# Patient Record
Sex: Female | Born: 2000
Health system: Southern US, Community
[De-identification: ages and names within clinical notes are randomized; demographics above are authoritative.]

## PROBLEM LIST (undated history)

## (undated) DIAGNOSIS — N200 Calculus of kidney: Secondary | ICD-10-CM

## (undated) HISTORY — DX: Calculus of kidney: N20.0

---
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
## 2021-01-01 ENCOUNTER — Other Ambulatory Visit: Payer: Self-pay | Admitting: Physician Assistant

## 2021-01-01 DIAGNOSIS — F419 Anxiety disorder, unspecified: Secondary | ICD-10-CM

## 2021-01-01 DIAGNOSIS — F32A Depression, unspecified: Secondary | ICD-10-CM

## 2021-01-18 ENCOUNTER — Other Ambulatory Visit: Payer: Self-pay | Admitting: Physician Assistant

## 2021-01-18 DIAGNOSIS — J45990 Exercise induced bronchospasm: Secondary | ICD-10-CM

## 2021-01-19 ENCOUNTER — Encounter: Payer: Self-pay | Admitting: Physician Assistant

## 2021-01-19 ENCOUNTER — Ambulatory Visit (INDEPENDENT_AMBULATORY_CARE_PROVIDER_SITE_OTHER): Payer: BC Managed Care – PPO | Admitting: Physician Assistant

## 2021-01-19 ENCOUNTER — Other Ambulatory Visit: Payer: Self-pay

## 2021-01-19 VITALS — BP 112/73 | HR 86 | Temp 98.0°F | Ht 64.0 in | Wt 118.0 lb

## 2021-01-19 DIAGNOSIS — F419 Anxiety disorder, unspecified: Secondary | ICD-10-CM | POA: Diagnosis not present

## 2021-01-19 DIAGNOSIS — Z0000 Encounter for general adult medical examination without abnormal findings: Secondary | ICD-10-CM | POA: Diagnosis not present

## 2021-01-19 NOTE — Patient Instructions (Signed)
Preventive Care 21-19 Years Old, Female Preventive care refers to lifestyle choices and visits with your health care provider that can promote health and wellness. This includes: A yearly physical exam. This is also called an annual wellness visit. Regular dental and eye exams. Immunizations. Screening for certain conditions. Healthy lifestyle choices, such as: Eating a healthy diet. Getting regular exercise. Not using drugs or products that contain nicotine and tobacco. Limiting alcohol use. What can I expect for my preventive care visit? Physical exam Your health care provider may check your: Height and weight. These may be used to calculate your BMI (body mass index). BMI is a measurement that tells if you are at a healthy weight. Heart rate and blood pressure. Body temperature. Skin for abnormal spots. Counseling Your health care provider may ask you questions about your: Past medical problems. Family's medical history. Alcohol, tobacco, and drug use. Emotional well-being. Home life and relationship well-being. Sexual activity. Diet, exercise, and sleep habits. Work and work environment. Access to firearms. Method of birth control. Menstrual cycle. Pregnancy history. What immunizations do I need? Vaccines are usually given at various ages, according to a schedule. Your health care provider will recommend vaccines for you based on your age, medical history, and lifestyle or other factors, such as travel or where you work. What tests do I need? Blood tests Lipid and cholesterol levels. These may be checked every 5 years starting at age 20. Hepatitis C test. Hepatitis B test. Screening Diabetes screening. This is done by checking your blood sugar (glucose) after you have not eaten for a while (fasting). STD (sexually transmitted disease) testing, if you are at risk. BRCA-related cancer screening. This may be done if you have a family history of breast, ovarian, tubal, or  peritoneal cancers. Pelvic exam and Pap test. This may be done every 3 years starting at age 21. Starting at age 30, this may be done every 5 years if you have a Pap test in combination with an HPV test. Talk with your health care provider about your test results, treatment options, and if necessary, the need for more tests. Follow these instructions at home: Eating and drinking  Eat a healthy diet that includes fresh fruits and vegetables, whole grains, lean protein, and low-fat dairy products. Take vitamin and mineral supplements as recommended by your health care provider. Do not drink alcohol if: Your health care provider tells you not to drink. You are pregnant, may be pregnant, or are planning to become pregnant. If you drink alcohol: Limit how much you have to 0-1 drink a day. Be aware of how much alcohol is in your drink. In the [place] one drink equals one 12 oz bottle of beer (355 mL), one 5 oz glass of wine (148 mL), or one 1 oz glass of hard liquor (44 mL). Lifestyle Take daily care of your teeth and gums. Brush your teeth every morning and night with fluoride toothpaste. Floss one time each day. Stay active. Exercise for at least 30 minutes 5 or more days each week. Do not use any products that contain nicotine or tobacco, such as cigarettes, e-cigarettes, and chewing tobacco. If you need help quitting, ask your health care provider. Do not use drugs. If you are sexually active, practice safe sex. Use a condom or other form of protection to prevent STIs (sexually transmitted infections). If you do not wish to become pregnant, use a form of birth control. If you plan to become pregnant, see your health care provider   for a prepregnancy visit. Find healthy ways to cope with stress, such as: Meditation, yoga, or listening to music. Journaling. Talking to a trusted person. Spending time with friends and family. Safety Always wear your seat belt while driving or riding in a  vehicle. Do not drive: If you have been drinking alcohol. Do not ride with someone who has been drinking. When you are tired or distracted. While texting. Wear a helmet and other protective equipment during sports activities. If you have firearms in your house, make sure you follow all gun safety procedures. Seek help if you have been physically or sexually abused. What's next? Go to your health care provider once a year for an annual wellness visit. Ask your health care provider how often you should have your eyes and teeth checked. Stay up to date on all vaccines. This information is not intended to replace advice given to you by your health care provider. Make sure you discuss any questions you have with your health care provider. Document Revised: 05/23/2020 Document Reviewed: 11/24/2017 Elsevier Patient Education  2022 Elsevier Inc.  

## 2021-01-19 NOTE — Progress Notes (Signed)
Subjective:     Christy Brown is a 20 y.o. female and is here for a comprehensive physical exam. The patient reports no problems.  Social History   Socioeconomic History   Marital status: Single    Spouse name: Not on file   Number of children: Not on file   Years of education: Not on file   Highest education level: Not on file  Occupational History   Not on file  Tobacco Use   Smoking status: Never   Smokeless tobacco: Never  Vaping Use   Vaping Use: Never used  Substance and Sexual Activity   Alcohol use: No   Drug use: No   Sexual activity: Yes    Birth control/protection: Pill    Comment: 1st intercourse 20 yo-Fewer than 5 partners  Other Topics Concern   Not on file  Social History Narrative   Not on file   Social Determinants of Health   Financial Resource Strain: Not on file  Food Insecurity: Not on file  Transportation Needs: Not on file  Physical Activity: Not on file  Stress: Not on file  Social Connections: Not on file  Intimate Partner Violence: Not on file   Health Maintenance  Topic Date Due   HPV VACCINES (1 - 2-dose series) Never done   CHLAMYDIA SCREENING  Never done   HIV Screening  Never done   Hepatitis C Screening  Never done   INFLUENZA VACCINE  Never done    The following portions of the patient's history were reviewed and updated as appropriate: allergies, current medications, past family history, past medical history, past social history, past surgical history, and problem list.  Review of Systems Pertinent items noted in HPI and remainder of comprehensive ROS otherwise negative.   Objective:    BP 112/73   Pulse 86   Temp 98 F (36.7 C)   Ht 5\' 4"  (1.626 m)   Wt 118 lb (53.5 kg)   SpO2 100%   BMI 20.25 kg/m  General appearance: alert, cooperative, and no distress Head: Normocephalic, without obvious abnormality, atraumatic Eyes: conjunctivae/corneas clear. PERRL, EOM's intact. Fundi benign. Ears: normal TM's and  external ear canals both ears Nose: Nares normal. Septum midline. Mucosa normal. No drainage or sinus tenderness. Throat: lips, mucosa, and tongue normal; teeth and gums normal Neck: no adenopathy, supple, symmetrical, trachea midline, and thyroid: normal to inspection and palpation Back: symmetric, no curvature. ROM normal. No CVA tenderness. Lungs: clear to auscultation bilaterally Breasts: Inspection negative, Taught monthly breast self examination Heart: regular rate and rhythm, S1, S2 normal, no murmur, click, rub or gallop Abdomen: soft, non-tender; bowel sounds normal; no masses,  no organomegaly Pelvic: deferred Extremities: extremities normal, atraumatic, no cyanosis or edema Pulses: 2+ and symmetric Skin: Skin color, texture, turgor normal. No rashes or lesions Lymph nodes: Cervical adenopathy: normal and Supraclavicular adenopathy: normal Neurologic: Grossly normal    Assessment:    Healthy female exam.     Plan:  -Patient declined GC/chlamydia screening, immunizations including influenza and HPV, Hep C and HIV screenings. -Continue to avoid tobacco and etoh use. -Continue to stay active and follow a heart healthy diet. -Patient reports not taking sertraline, discussed alternative SSRI's (Paxil, Lexapro, Celexa, Prozac). Will let me know if decides to try another medication. Recommend to incorporate mindfulness therapy. -Follow up in 6 months for mood  See After Visit Summary for Counseling Recommendations

## 2021-02-23 ENCOUNTER — Other Ambulatory Visit: Payer: Self-pay

## 2021-02-23 ENCOUNTER — Emergency Department (HOSPITAL_BASED_OUTPATIENT_CLINIC_OR_DEPARTMENT_OTHER)
Admission: EM | Admit: 2021-02-23 | Discharge: 2021-02-23 | Disposition: A | Discharge status: Home / Self Care | Payer: BC Managed Care – PPO | Attending: Emergency Medicine | Admitting: Emergency Medicine

## 2021-02-23 ENCOUNTER — Encounter (HOSPITAL_BASED_OUTPATIENT_CLINIC_OR_DEPARTMENT_OTHER): Payer: Self-pay

## 2021-02-23 ENCOUNTER — Emergency Department (HOSPITAL_BASED_OUTPATIENT_CLINIC_OR_DEPARTMENT_OTHER): Payer: BC Managed Care – PPO

## 2021-02-23 DIAGNOSIS — N132 Hydronephrosis with renal and ureteral calculous obstruction: Secondary | ICD-10-CM | POA: Diagnosis not present

## 2021-02-23 DIAGNOSIS — N201 Calculus of ureter: Secondary | ICD-10-CM

## 2021-02-23 DIAGNOSIS — R109 Unspecified abdominal pain: Secondary | ICD-10-CM | POA: Diagnosis present

## 2021-02-23 DIAGNOSIS — J45990 Exercise induced bronchospasm: Secondary | ICD-10-CM | POA: Diagnosis not present

## 2021-02-23 LAB — URINALYSIS, ROUTINE W REFLEX MICROSCOPIC
Glucose, UA: NEGATIVE mg/dL
Ketones, ur: NEGATIVE mg/dL
Leukocytes,Ua: NEGATIVE
Nitrite: NEGATIVE
Protein, ur: 100 mg/dL — AB
Specific Gravity, Urine: 1.025 (ref 1.005–1.030)
pH: 7 (ref 5.0–8.0)

## 2021-02-23 LAB — URINALYSIS, MICROSCOPIC (REFLEX)

## 2021-02-23 LAB — CBC
HCT: 36.9 % (ref 36.0–46.0)
Hemoglobin: 12.6 g/dL (ref 12.0–15.0)
MCH: 28.8 pg (ref 26.0–34.0)
MCHC: 34.1 g/dL (ref 30.0–36.0)
MCV: 84.4 fL (ref 80.0–100.0)
Platelets: 225 10*3/uL (ref 150–400)
RBC: 4.37 MIL/uL (ref 3.87–5.11)
RDW: 13.1 % (ref 11.5–15.5)
WBC: 10.8 10*3/uL — ABNORMAL HIGH (ref 4.0–10.5)
nRBC: 0 % (ref 0.0–0.2)

## 2021-02-23 LAB — BASIC METABOLIC PANEL
Anion gap: 10 (ref 5–15)
BUN: 11 mg/dL (ref 6–20)
CO2: 23 mmol/L (ref 22–32)
Calcium: 9 mg/dL (ref 8.9–10.3)
Chloride: 103 mmol/L (ref 98–111)
Creatinine, Ser: 1.02 mg/dL — ABNORMAL HIGH (ref 0.44–1.00)
GFR, Estimated: >60 mL/min (ref >60)
Glucose, Bld: 143 mg/dL — ABNORMAL HIGH (ref 70–99)
Potassium: 3.7 mmol/L (ref 3.5–5.1)
Sodium: 136 mmol/L (ref 135–145)

## 2021-02-23 LAB — LIPASE, BLOOD: Lipase: 25 U/L (ref 11–51)

## 2021-02-23 LAB — PREGNANCY, URINE: Preg Test, Ur: NEGATIVE

## 2021-02-23 MED ORDER — ONDANSETRON HCL 4 MG PO TABS: 4.0000 mg | ORAL_TABLET | Freq: Three times a day (TID) | ORAL | 0 refills | Status: AC | PRN

## 2021-02-23 MED ORDER — KETOROLAC TROMETHAMINE 30 MG/ML IJ SOLN
30.0000 mg | Freq: Once | INTRAMUSCULAR | Status: AC
  Administered 2021-02-23: 15:00:00 30 mg via INTRAVENOUS
  Filled 2021-02-23: qty 1

## 2021-02-23 MED ORDER — ONDANSETRON HCL 4 MG/2ML IJ SOLN
4.0000 mg | Freq: Once | INTRAMUSCULAR | Status: AC
  Administered 2021-02-23: 15:00:00 4 mg via INTRAVENOUS
  Filled 2021-02-23: qty 2

## 2021-02-23 MED ORDER — ONDANSETRON HCL 4 MG/2ML IJ SOLN
4.0000 mg | Freq: Once | INTRAMUSCULAR | Status: AC | PRN
  Administered 2021-02-23: 12:00:00 4 mg via INTRAVENOUS
  Filled 2021-02-23: qty 2

## 2021-02-23 NOTE — ED Triage Notes (Signed)
Right flank pain x today with vomiting - Patient has history of kidney stones.

## 2021-02-23 NOTE — ED Provider Notes (Signed)
[facility] EMERGENCY DEPARTMENT Provider Note   CSN: [id number] Arrival date & time: 02/23/21  1043     History Chief Complaint  Patient presents with   Flank Pain    Christy Brown is a 20 y.o. female.  Patient has a past medical history of nephrolithiasis.  She presents today with right flank pain that started about six hours ago.  She has had 4 episodes of nonbloody and nonbilious vomiting with associated nausea.  She has had some difficulty keeping p.o. intake down.  Pain has been constant.  She has had a kidney stone in the past with similar symptoms.  He denies any dysuria, hematuria, chest pain, shortness of breath, abdominal pain, fevers, or chills.   Flank Pain Pertinent negatives include no chest pain, no abdominal pain, no headaches and no shortness of breath.      Past Medical History:  Diagnosis Date   Kidney stone     Patient Active Problem List   Diagnosis Date Noted   Exercise-induced asthma 09/20/2019   Dysmenorrhea in adolescent 04/27/2017    History reviewed. No pertinent surgical history.   OB History     Gravida  0   Para  0   Term  0   Preterm  0   AB  0   Living  0      SAB  0   IAB  0   Ectopic  0   Multiple  0   Live Births  0           Family History  Problem Relation Age of Onset   Prostate cancer Father    Heart disease Maternal Grandfather    Heart attack Maternal Grandfather     Social History   Tobacco Use   Smoking status: Never   Smokeless tobacco: Never  Vaping Use   Vaping Use: Never used  Substance Use Topics   Alcohol use: No   Drug use: No    Home Medications Prior to Admission medications   Medication Sig Start Date End Date Taking? Authorizing Provider  ondansetron (ZOFRAN) 4 MG tablet Take 1 tablet (4 mg total) by mouth every 8 (eight) hours as needed for up to 10 days for nausea or vomiting. 02/23/21 03/05/21 Yes Anaise Sterbenz, Finis Bud, PA-C  albuterol (VENTOLIN HFA) 108 (90  Base) MCG/ACT inhaler TAKE 2 PUFFS BY MOUTH EVERY 6 HOURS AS NEEDED FOR WHEEZE OR SHORTNESS OF BREATH 01/19/21   Abonza, Maritza, PA-C  drospirenone-ethinyl estradiol (NIKKI) 3-0.02 MG tablet Take 1 tablet by mouth daily. 10/20/20   Wyline Beady A, NP  sertraline (ZOLOFT) 25 MG tablet TAKE 1 TABLET (25 MG TOTAL) BY MOUTH DAILY. 01/01/21   Mayer Masker, PA-C    Allergies    Patient has no known allergies.  Review of Systems   Review of Systems  Constitutional:  Negative for chills and fever.  HENT:  Negative for congestion, rhinorrhea and sore throat.   Eyes:  Negative for visual disturbance.  Respiratory:  Negative for cough, chest tightness and shortness of breath.   Cardiovascular:  Negative for chest pain, palpitations and leg swelling.  Gastrointestinal:  Positive for nausea and vomiting. Negative for abdominal pain, blood in stool, constipation and diarrhea.  Genitourinary:  Positive for flank pain. Negative for dysuria and hematuria.  Musculoskeletal:  Negative for back pain.  Skin:  Negative for rash and wound.  Neurological:  Negative for dizziness, syncope, weakness, light-headedness and headaches.  Psychiatric/Behavioral:  Negative for confusion.  All other systems reviewed and are negative.  Physical Exam Updated Vital Signs BP 117/69   Pulse 70   Temp 97.7 F (36.5 C) (Oral)   Resp 18   Ht 5\' 4"  (1.626 m)   Wt 52.2 kg   LMP 01/30/2021 Comment: neg upreg in er today.  SpO2 99%   BMI 19.74 kg/m   Physical Exam Vitals and nursing note reviewed.  Constitutional:      General: She is not in acute distress.    Appearance: Normal appearance. She is well-developed. She is not ill-appearing, toxic-appearing or diaphoretic.  HENT:     Head: Normocephalic and atraumatic.     Nose: No nasal deformity.     Mouth/Throat:     Lips: Pink. No lesions.  Eyes:     General: Gaze aligned appropriately. No scleral icterus.       Right eye: No discharge.        Left eye:  No discharge.     Conjunctiva/sclera: Conjunctivae normal.     Right eye: Right conjunctiva is not injected. No exudate or hemorrhage.    Left eye: Left conjunctiva is not injected. No exudate or hemorrhage. Pulmonary:     Effort: Pulmonary effort is normal. No respiratory distress.  Abdominal:     General: Abdomen is flat. There is no distension.     Palpations: Abdomen is soft.     Tenderness: There is no abdominal tenderness. There is right CVA tenderness. There is no left CVA tenderness, guarding or rebound.  Skin:    General: Skin is warm and dry.     Coloration: Skin is not jaundiced or pale.     Findings: No bruising, erythema, lesion or rash.  Neurological:     Mental Status: She is alert and oriented to person, place, and time.  Psychiatric:        Mood and Affect: Mood normal.        Speech: Speech normal.        Behavior: Behavior normal. Behavior is cooperative.    ED Results / Procedures / Treatments   Labs (all labs ordered are listed, but only abnormal results are displayed) Labs Reviewed  URINALYSIS, ROUTINE W REFLEX MICROSCOPIC - Abnormal; Notable for the following components:      Result Value   APPearance HAZY (*)    Hgb urine dipstick SMALL (*)    Bilirubin Urine SMALL (*)    Protein, ur 100 (*)    All other components within normal limits  BASIC METABOLIC PANEL - Abnormal; Notable for the following components:   Glucose, Bld 143 (*)    Creatinine, Ser 1.02 (*)    All other components within normal limits  CBC - Abnormal; Notable for the following components:   WBC 10.8 (*)    All other components within normal limits  URINALYSIS, MICROSCOPIC (REFLEX) - Abnormal; Notable for the following components:   Bacteria, UA FEW (*)    All other components within normal limits  PREGNANCY, URINE  LIPASE, BLOOD    EKG None  Radiology CT Renal Stone Study  Result Date: 02/23/2021 CLINICAL DATA:  Right flank pain with nausea and vomiting. Kidney stone  suspected. EXAM: CT ABDOMEN AND PELVIS WITHOUT CONTRAST TECHNIQUE: Multidetector CT imaging of the abdomen and pelvis was performed following the standard protocol without IV contrast. COMPARISON:  None. FINDINGS: Lower chest: Normal Hepatobiliary: Normal Pancreas: Normal Spleen: Normal Adrenals/Urinary Tract: Adrenal glands are normal. Left kidney is normal except for a nonobstructing 2  mm stone in the midportion. Right kidney shows swelling and hydroureteronephrosis with the ureter being dilated all the way to the UVJ where there is a 1.5 mm stone. Stomach/Bowel: Normal Vascular/Lymphatic: Normal Reproductive: Normal Other: None Musculoskeletal: Normal IMPRESSION: 1.5 mm stone at the right UVJ with mild right hydroureteronephrosis. 2 mm nonobstructing stone in the midportion of the left kidney. Electronically Signed   By: Nelson Chimes M.D.   On: 02/23/2021 14:00    Procedures Procedures   Medications Ordered in ED Medications  ondansetron (ZOFRAN) injection 4 mg (4 mg Intravenous Given 02/23/21 1218)  ketorolac (TORADOL) 30 MG/ML injection 30 mg (30 mg Intravenous Given 02/23/21 1443)  ondansetron (ZOFRAN) injection 4 mg (4 mg Intravenous Given 02/23/21 1451)    ED Course  I have reviewed the triage vital signs and the nursing notes.  Pertinent labs & imaging results that were available during my care of the patient were reviewed by me and considered in my medical decision making (see chart for details).  Clinical Course as of 02/23/21 2321  Mon Feb 23, 2021  1502 Right stone in ureter [GL]    Clinical Course User Index [GL] Stacee Earp, Adora Fridge, PA-C   MDM Rules/Calculators/A&P                          With known history of nephrolithiasis presents the emergency department with 6 hours of right-sided flank pain that was associated with nausea and vomiting. She is afebrile and vital stable on arrival. Exam significant for right CVA tenderness with normal abdominal exam.  Labs are  notable for trace leukocytosis to 10.8.  CBC otherwise unremarkable.  BMP with normal creatinine.  Urinalysis with no evidence of infection.  CT stone study is notable for a 1.5 mm stone at the right UVJ with mild hydroureteronephrosis.  These findings are consistent with patient's presenting symptoms.  The stone is fairly small, so patient will likely pass on her own.  Her symptoms were treated with Zofran and Toradol while in the ED.  She was given a liter of fluids due to decreased p.o. intake.  She has no signs of infected stone or acute kidney injury.  Feel she should be able to treat her symptoms at home safely.  She has never been to see urology and has had multiple kidney stones in the past.  We will send her home with follow-up to urology.   Final Clinical Impression(s) / ED Diagnoses Final diagnoses:  Ureterolithiasis    Rx / DC Orders ED Discharge Orders          Ordered    ondansetron (ZOFRAN) 4 MG tablet  Every 8 hours PRN        02/23/21 1628             Adolphus Birchwood, PA-C 02/23/21 2321    Margette Fast, MD 03/02/21 1036

## 2021-02-23 NOTE — Discharge Instructions (Signed)
Please follow up with Urology. Schedule an appointment in the morning.  I have prescribed you Zofran for your nausea.  You can take 600 mg Ibuprofen every 6-8 hours until your pain improves. Do not take this longer than 5 days. If you develop fevers or worsening symptoms, please return to the ED.

## 2021-03-29 ENCOUNTER — Other Ambulatory Visit: Payer: Self-pay | Admitting: Physician Assistant

## 2021-03-29 DIAGNOSIS — F419 Anxiety disorder, unspecified: Secondary | ICD-10-CM

## 2021-03-29 DIAGNOSIS — F32A Depression, unspecified: Secondary | ICD-10-CM

## 2021-09-27 ENCOUNTER — Other Ambulatory Visit: Payer: Self-pay | Admitting: Physician Assistant

## 2021-09-27 DIAGNOSIS — F32A Depression, unspecified: Secondary | ICD-10-CM

## 2021-09-27 DIAGNOSIS — F419 Anxiety disorder, unspecified: Secondary | ICD-10-CM

## 2021-10-21 ENCOUNTER — Ambulatory Visit (INDEPENDENT_AMBULATORY_CARE_PROVIDER_SITE_OTHER): Payer: BC Managed Care – PPO | Admitting: Nurse Practitioner

## 2021-10-21 ENCOUNTER — Encounter: Payer: Self-pay | Admitting: Nurse Practitioner

## 2021-10-21 VITALS — BP 108/74 | HR 100 | Ht 64.0 in | Wt 113.0 lb

## 2021-10-21 DIAGNOSIS — Z01419 Encounter for gynecological examination (general) (routine) without abnormal findings: Secondary | ICD-10-CM

## 2021-10-21 DIAGNOSIS — N946 Dysmenorrhea, unspecified: Secondary | ICD-10-CM

## 2021-10-21 DIAGNOSIS — R110 Nausea: Secondary | ICD-10-CM

## 2021-10-21 DIAGNOSIS — Z3041 Encounter for surveillance of contraceptive pills: Secondary | ICD-10-CM

## 2021-10-21 DIAGNOSIS — Z113 Encounter for screening for infections with a predominantly sexual mode of transmission: Secondary | ICD-10-CM

## 2021-10-21 DIAGNOSIS — Z0000 Encounter for general adult medical examination without abnormal findings: Secondary | ICD-10-CM

## 2021-10-21 MED ORDER — DROSPIRENONE-ETHINYL ESTRADIOL 3-0.02 MG PO TABS: 1.0000 | ORAL_TABLET | Freq: Every day | ORAL | 3 refills | Status: DC

## 2021-10-21 NOTE — Progress Notes (Signed)
   Christy Brown 2020/06/30 [id number]   History:  21 y.o. G0 presents for annual exam without GYN complaints. Monthly cycles. Started COCs for dysmenorrhea, good management.  Declines Gardasil. Anxiety and depression managed by PCP. Reports severe nausea and vomiting a few days before menses recently. Missed 4 doses of Zoloft at that time.   Gynecologic History Patient's last menstrual period was 10/09/2021 (approximate). Period Cycle (Days): 28 Period Duration (Days): 3-5 Menstrual Flow:  (starts light, gets heavy, then finishes light) Menstrual Control: Tampon Dysmenorrhea: (!) Severe Dysmenorrhea Symptoms: Cramping, Nausea Contraception/Family planning: OCP (estrogen/progesterone) Sexually active: Yes  Health Maintenance Last Pap: Not indicated Last mammogram: Not indicated Last colonoscopy: Not indicated Last Dexa: Not indicated  Past medical history, past surgical history, family history and social history were all reviewed and documented in the EPIC chart. 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 Plans to work with brother.   ROS:  A ROS was performed and pertinent positives and negatives are included.  Exam:  Vitals:   10/21/21 1501  BP: 108/74  Pulse: 100  SpO2: 98%  Weight: 113 lb (51.3 kg)  Height: 5\' 4"  (1.626 m)     Body mass index is 19.4 kg/m.  General appearance:  Normal Thyroid:  Symmetrical, normal in size, without palpable masses or nodularity. Respiratory  Auscultation:  Clear without wheezing or rhonchi Cardiovascular  Auscultation:  Regular rate, without rubs, murmurs or gallops  Edema/varicosities:  Not grossly evident Abdominal  Soft,nontender, without masses, guarding or rebound.  Liver/spleen:  No organomegaly noted  Hernia:  None appreciated  Skin  Inspection:  Grossly normal Breasts: Not indicated per guidelines Genitourinary   Inguinal/mons:  Normal without inguinal adenopathy  External genitalia:  Normal  appearing vulva with no masses, tenderness, or lesions  BUS/Urethra/Skene's glands:  Normal  Vagina:  Normal appearing with normal color and discharge, no lesions  Cervix:  Normal appearing without discharge or lesions  Uterus:  Normal in size, shape and contour.  Midline and mobile, nontender  Adnexa/parametria:     Rt: Normal in size, without masses or tenderness.   Lt: Normal in size, without masses or tenderness.  Anus and perineum: Normal  Digital rectal exam: Normal sphincter tone without palpated masses or tenderness  Patient informed chaperone available to be present for breast and pelvic exam. Patient has requested no chaperone to be present. Patient has been advised what will be completed during breast and pelvic exam.    Assessment/Plan:  21 y.o. G0 for annual exam.   Well female exam with routine gynecological exam - Education provided on SBEs, importance of preventative screenings, current guidelines, high calcium diet, regular exercise, safe sex, and multivitamin daily.   Encounter for surveillance of contraceptive pills - Plan: drospirenone-ethinyl estradiol (NIKKI) 3-0.02 MG tablet daily. Taking as prescribe. Refill x 1 year provided.   Dysmenorrhea - Plan: drospirenone-ethinyl estradiol (NIKKI) 3-0.02 MG tablet daily. Good management.   Screen for STD (sexually transmitted disease) - Plan: SURESWAB CT/NG/T. vaginalis  Nausea - likely s/t missed Zoloft. Nausea has resolved since restarting. Discussed withdrawal symptoms and not to stop abruptly.   Follow up in 1 year for annual.     MCKENIZE MEZERA [facility], 3:18 PM 10/21/2021

## 2021-10-22 LAB — SURESWAB CT/NG/T. VAGINALIS
C. trachomatis RNA, TMA: NOT DETECTED
N. gonorrhoeae RNA, TMA: NOT DETECTED
Trichomonas vaginalis RNA: NOT DETECTED

## 2021-11-16 ENCOUNTER — Telehealth: Payer: Self-pay | Admitting: Physician Assistant

## 2021-11-16 DIAGNOSIS — F32A Depression, unspecified: Secondary | ICD-10-CM

## 2021-11-16 DIAGNOSIS — F419 Anxiety disorder, unspecified: Secondary | ICD-10-CM

## 2021-11-16 NOTE — Telephone Encounter (Signed)
Patient requesting refill of Zoloft. Please advise.  

## 2021-11-17 MED ORDER — SERTRALINE HCL 25 MG PO TABS: 25.0000 mg | ORAL_TABLET | Freq: Every day | ORAL | 0 refills | Status: DC

## 2021-11-17 NOTE — Telephone Encounter (Signed)
Tried calling patient back-no answer and vm not set up

## 2021-11-17 NOTE — Telephone Encounter (Signed)
30 day refill sent to the pharmacy. Patient will need to schedule an appointment for further medication refills per last office note. AS< CMA

## 2022-01-20 ENCOUNTER — Ambulatory Visit (INDEPENDENT_AMBULATORY_CARE_PROVIDER_SITE_OTHER): Payer: BC Managed Care – PPO | Admitting: Physician Assistant

## 2022-01-20 ENCOUNTER — Encounter: Payer: Self-pay | Admitting: Physician Assistant

## 2022-01-20 VITALS — BP 98/64 | HR 73 | Temp 97.7°F | Ht 64.0 in | Wt 114.0 lb

## 2022-01-20 DIAGNOSIS — F32A Depression, unspecified: Secondary | ICD-10-CM | POA: Diagnosis not present

## 2022-01-20 DIAGNOSIS — F419 Anxiety disorder, unspecified: Secondary | ICD-10-CM

## 2022-01-20 DIAGNOSIS — Z0000 Encounter for general adult medical examination without abnormal findings: Secondary | ICD-10-CM | POA: Diagnosis not present

## 2022-01-20 MED ORDER — SERTRALINE HCL 25 MG PO TABS: 25.0000 mg | ORAL_TABLET | Freq: Every day | ORAL | 0 refills | Status: DC

## 2022-01-20 MED ORDER — PROPRANOLOL HCL 40 MG PO TABS: 40.0000 mg | ORAL_TABLET | Freq: Two times a day (BID) | ORAL | 1 refills | Status: DC | PRN

## 2022-01-20 NOTE — Progress Notes (Signed)
Complete physical exam   Patient: Christy Brown   DOB: 2000/07/26   21 y.o. Female  MRN: [id number] Visit Date: 01/20/2022   Chief Complaint  Patient presents with   Annual Exam   Subjective    Christy Brown is a 21 y.o. female who presents today for a complete physical exam.  She reports consuming a general diet. The patient does not participate in regular exercise at present. She generally feels fairly well. She does have additional problems to discuss today- anxiety. Patient is currently a full-time student at [employer] and has two jobs so has a lot on her plate.     Past Medical History:  Diagnosis Date   Kidney stone    No past surgical history on file. Social History   Socioeconomic History   Marital status: Single    Spouse name: Not on file   Number of children: Not on file   Years of education: Not on file   Highest education level: Not on file  Occupational History   Not on file  Tobacco Use   Smoking status: Never   Smokeless tobacco: Never  Vaping Use   Vaping Use: Never used  Substance and Sexual Activity   Alcohol use: Yes    Comment: occasionally   Drug use: No   Sexual activity: Yes    Partners: Male    Birth control/protection: Pill    Comment: 1st intercourse 21 yo-Fewer than 5 partners  Other Topics Concern   Not on file  Social History Narrative   Not on file   Social Determinants of Health   Financial Resource Strain: Not on file  Food Insecurity: Not on file  Transportation Needs: Not on file  Physical Activity: Not on file  Stress: Not on file  Social Connections: Not on file  Intimate Partner Violence: Not on file     Medications: Outpatient Medications Prior to Visit  Medication Sig   albuterol (VENTOLIN HFA) 108 (90 Base) MCG/ACT inhaler TAKE 2 PUFFS BY MOUTH EVERY 6 HOURS AS NEEDED FOR WHEEZE OR SHORTNESS OF BREATH   drospirenone-ethinyl estradiol (NIKKI) 3-0.02 MG tablet Take 1 tablet by mouth daily.    [DISCONTINUED] sertraline (ZOLOFT) 25 MG tablet Take 1 tablet (25 mg total) by mouth daily.   No facility-administered medications prior to visit.    Review of Systems Review of Systems:  A fourteen system review of systems was performed and found to be positive as per HPI.  Last CBC Lab Results  Component Value Date   WBC 10.8 (H) 02/23/2021   HGB 12.6 02/23/2021   HCT 36.9 02/23/2021   MCV 84.4 02/23/2021   MCH 28.8 02/23/2021   RDW 13.1 02/23/2021   PLT 225 40/98/1191   Last metabolic panel Lab Results  Component Value Date   GLUCOSE 143 (H) 02/23/2021   NA 136 02/23/2021   K 3.7 02/23/2021   CL 103 02/23/2021   CO2 23 02/23/2021   BUN 11 02/23/2021   CREATININE 1.02 (H) 02/23/2021   GFRNONAA >60 02/23/2021   CALCIUM 9.0 02/23/2021   ANIONGAP 10 02/23/2021   Last lipids No results found for: "CHOL", "HDL", "LDLCALC", "LDLDIRECT", "TRIG", "CHOLHDL" Last hemoglobin A1c No results found for: "HGBA1C" Last thyroid functions Lab Results  Component Value Date   TSH 1.25 04/27/2017   Last vitamin D No results found for: "25OHVITD2", "25OHVITD3", "VD25OH"    Objective    BP 98/64   Pulse 73   Temp 97.7 F (36.5  C) (Temporal)   Ht _0  (1.626 m)   Wt 114 lb (51.7 kg)   BMI 19.57 kg/m  BP Readings from Last 3 Encounters:  01/20/22 98/64  10/21/21 108/74  02/23/21 117/69   Wt Readings from Last 3 Encounters:  01/20/22 114 lb (51.7 kg) (22 %, Z= -0.77)*  10/21/21 113 lb (51.3 kg) (21 %, Z= -0.82)*  02/23/21 115 lb (52.2 kg) (27 %, Z= -0.62)*   * Growth percentiles are based on CDC (Girls, 2-20 Years) data.      Physical Exam   General Appearance:    Alert, cooperative, in no acute distress, appears stated age   Head:    Normocephalic, without obvious abnormality, atraumatic  Eyes:    PERRL, conjunctiva/corneas clear, EOM's intact, fundi    benign, both eyes  Ears:    Normal TM's and external ear canals, both ears  Nose:   Nares normal, septum  midline, mucosa normal, no drainage    or sinus tenderness  Throat:   Lips, mucosa, and tongue normal; teeth and gums normal  Neck:   Supple, symmetrical, trachea midline, no adenopathy;    thyroid:  no enlargement/tenderness/nodules; no JVD  Back:     Symmetric, no curvature, ROM normal, no CVA tenderness  Lungs:     Clear to auscultation bilaterally, respirations unlabored  Chest Wall:    No tenderness or deformity   Heart:    Normal heart rate. Normal rhythm. No murmurs, rubs, or gallops.   Breast Exam:    deferred  Abdomen:     Soft, non-tender, bowel sounds active all four quadrants,    no masses, no organomegaly  Pelvic:    deferred  Extremities:   All extremities are intact. No cyanosis or edema  Pulses:   2+ and symmetric all extremities  Skin:   Skin color, texture, turgor normal, no rashes or suspicious lesions  Lymph nodes:   Cervical, supraclavicular nodes normal  Neurologic:   CNII-XII grossly intact.     Last depression screening scores    01/20/2022   10:54 AM 01/19/2021    2:16 PM 07/14/2020    2:54 PM  PHQ 2/9 Scores  PHQ - 2 Score 6 2 0  PHQ- 9 Score _1 Last fall risk screening    01/20/2022   10:54 AM  Fall Risk   Falls in the past year? 0  Number falls in past yr: 0  Injury with Fall? 0  Risk for fall due to : No Fall Risks  Follow up Falls evaluation completed     No results found for any visits on 01/20/22.  Assessment & Plan    Routine Health Maintenance and Physical Exam  Exercise Activities and Dietary recommendations -A heart healthy diet low in fat and carbohydrates. Recommend moderate exercise 150 mins/wk.  Immunization History  Administered Date(s) Administered   HIB (PRP-OMP) 05/07/2002, 07/10/2002, 09/04/2002, 03/29/2003   Hepatitis A 12/16/2009, 12/08/2010   Hepatitis B 01-20-2002, 05/07/2002, 12/08/2002   MMR 03/29/2003, 11/18/2006   Meningococcal B, OMV 11/08/2019   Tdap 03/15/2019   Varicella 06/10/2003, 11/18/2006     Health Maintenance  Topic Date Due   HPV VACCINES (1 - 2-dose series) Never done   HIV Screening  Never done   Hepatitis C Screening  Never done   INFLUENZA VACCINE  Never done   CHLAMYDIA SCREENING  10/22/2022   TETANUS/TDAP  03/14/2029    Discussed health benefits of physical activity, and encouraged her to  engage in regular exercise appropriate for her age and condition.  Problem List Items Addressed This Visit   None Visit Diagnoses     Healthcare maintenance    -  Primary   Anxiety       Relevant Medications   propranolol (INDERAL) 40 MG tablet   sertraline (ZOLOFT) 25 MG tablet   Depression, unspecified depression type       Relevant Medications   sertraline (ZOLOFT) 25 MG tablet      Pt deferred flu vaccine. 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 Provided medication refill for sertraline 25 mg daily. Advised if decides to pursue [place] therapy to let me know and will place referral. Recommend to follow-up in 4 months to reassess mood and medication therapy.   Return in about 4 months (around 05/23/2022) for Mood.       Lorrene Reid, PA-C  [facility] at [facility] [phone] (phone) [phone] (fax)  [place]

## 2022-01-20 NOTE — Patient Instructions (Signed)
Preventive Care 50-20 Years Old, Female Preventive care refers to lifestyle choices and visits with your health care provider that can promote health and wellness. At this stage in your life, you may start seeing a primary care physician instead of a pediatrician for your preventive care. Preventive care visits are also called wellness exams. What can I expect for my preventive care visit? Counseling During your preventive care visit, your health care provider may ask about your: Medical history, including: Past medical problems. Family medical history. Pregnancy history. Current health, including: Menstrual cycle. Method of birth control. Emotional well-being. Home life and relationship well-being. Sexual activity and sexual health. Lifestyle, including: Alcohol, nicotine or tobacco, and drug use. Access to firearms. Diet, exercise, and sleep habits. Sunscreen use. Motor vehicle safety. Physical exam Your health care provider may check your: Height and weight. These may be used to calculate your BMI (body mass index). BMI is a measurement that tells if you are at a healthy weight. Waist circumference. This measures the distance around your waistline. This measurement also tells if you are at a healthy weight and may help predict your risk of certain diseases, such as type 2 diabetes and high blood pressure. Heart rate and blood pressure. Body temperature. Skin for abnormal spots. Breasts. What immunizations do I need?  Vaccines are usually given at various ages, according to a schedule. Your health care provider will recommend vaccines for you based on your age, medical history, and lifestyle or other factors, such as travel or where you work. What tests do I need? Screening Your health care provider may recommend screening tests for certain conditions. This may include: Vision and hearing tests. Lipid and cholesterol levels. Pelvic exam and Pap test. Hepatitis B  test. Hepatitis C test. HIV (human immunodeficiency virus) test. STI (sexually transmitted infection) testing, if you are at risk. Tuberculosis skin test if you have symptoms. BRCA-related cancer screening. This may be done if you have a family history of breast, ovarian, tubal, or peritoneal cancers. Talk with your health care provider about your test results, treatment options, and if necessary, the need for more tests. Follow these instructions at home: Eating and drinking  Eat a healthy diet that includes fresh fruits and vegetables, whole grains, lean protein, and low-fat dairy products. Drink enough fluid to keep your urine pale yellow. Do not drink alcohol if: Your health care provider tells you not to drink. You are pregnant, may be pregnant, or are planning to become pregnant. You are under the legal drinking age. In the [place] the legal drinking age is 67. If you drink alcohol: Limit how much you have to 0-1 drink a day. Know how much alcohol is in your drink. In the [place] one drink equals one 12 oz bottle of beer (355 mL), one 5 oz glass of wine (148 mL), or one 1 oz glass of hard liquor (44 mL). Lifestyle Brush your teeth every morning and night with fluoride toothpaste. Floss one time each day. Exercise for at least 30 minutes 5 or more days of the week. Do not use any products that contain nicotine or tobacco. These products include cigarettes, chewing tobacco, and vaping devices, such as e-cigarettes. If you need help quitting, ask your health care provider. Do not use drugs. If you are sexually active, practice safe sex. Use a condom or other form of protection to prevent STIs. If you do not wish to become pregnant, use a form of birth control. If you plan to become pregnant,  see your health care provider for a prepregnancy visit. Find healthy ways to manage stress, such as: Meditation, yoga, or listening to music. Journaling. Talking to a trusted person. Spending time  with friends and family. Safety Always wear your seat belt while driving or riding in a vehicle. Do not drive: If you have been drinking alcohol. Do not ride with someone who has been drinking. When you are tired or distracted. While texting. If you have been using any mind-altering substances or drugs. Wear a helmet and other protective equipment during sports activities. If you have firearms in your house, make sure you follow all gun safety procedures. Seek help if you have been bullied, physically abused, or sexually abused. Use the internet responsibly to avoid dangers, such as online bullying and online sex predators. What's next? Go to your health care provider once a year for an annual wellness visit. Ask your health care provider how often you should have your eyes and teeth checked. Stay up to date on all vaccines. This information is not intended to replace advice given to you by your health care provider. Make sure you discuss any questions you have with your health care provider. Document Revised: 09/10/2020 Document Reviewed: 09/10/2020 Elsevier Patient Education  2023 [employer] Inc[employer]  

## 2022-02-16 ENCOUNTER — Other Ambulatory Visit: Payer: Self-pay | Admitting: Physician Assistant

## 2022-02-16 DIAGNOSIS — F419 Anxiety disorder, unspecified: Secondary | ICD-10-CM

## 2022-05-15 IMAGING — CT CT RENAL STONE PROTOCOL
2 of 4 series · 16 of 46 positions shown, 18 images · non-contrast
Comparison: None.

CLINICAL DATA: Right flank pain with nausea and vomiting. Kidney
stone suspected.

EXAM:
CT ABDOMEN AND PELVIS WITHOUT CONTRAST
TECHNIQUE: Multidetector CT imaging of the abdomen and pelvis was performed
following the standard protocol without IV contrast.

[Series 2: axial st · axial · 0.77mm/px · z∈[-448,-58]mm · 13 of 86 slices shown, 15 images]
[im 4/86  soft-tissue]
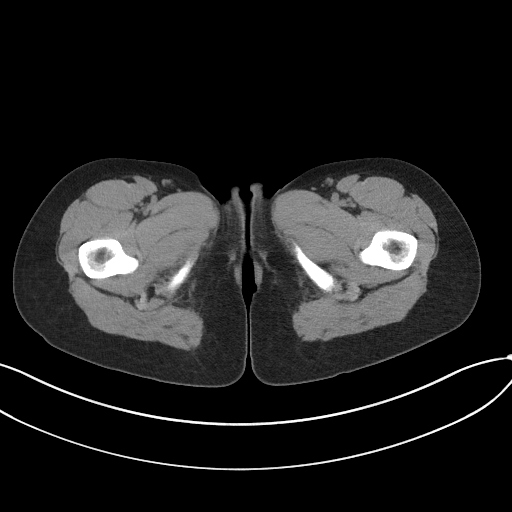
[im 4/86  bone]
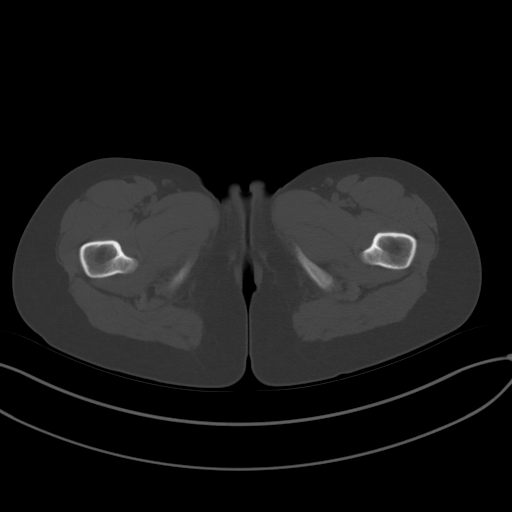
[im 11/86  soft-tissue]
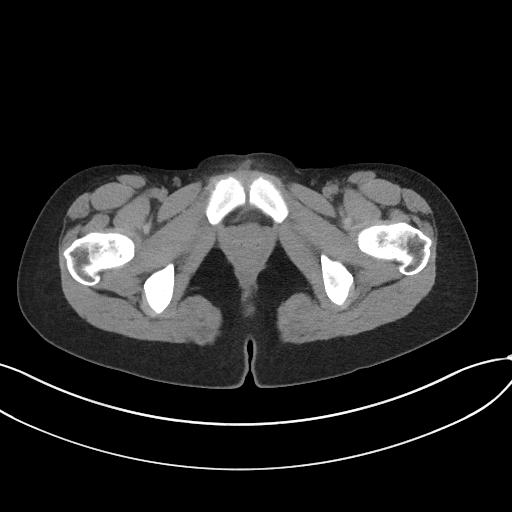
[im 18/86  soft-tissue]
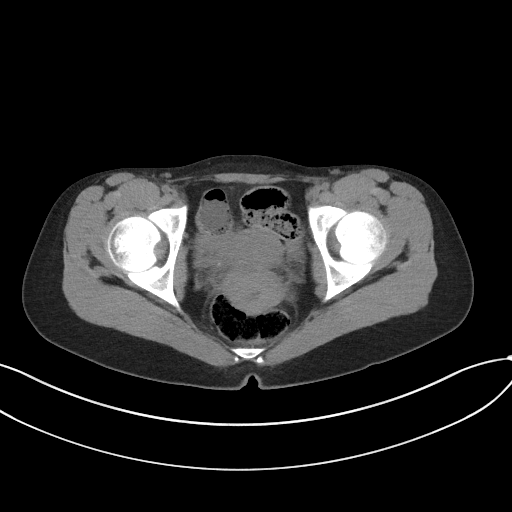
[im 24/86  soft-tissue]
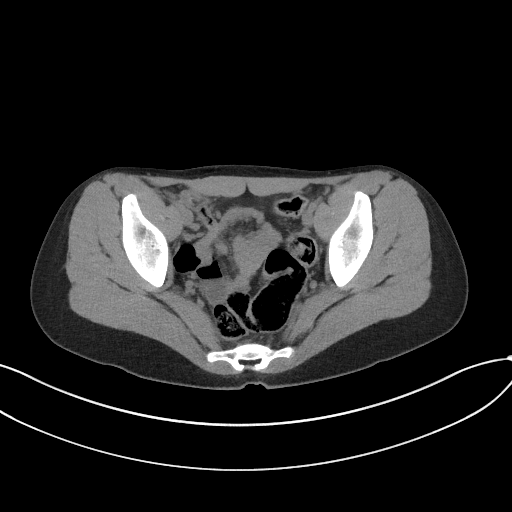
[im 31/86  soft-tissue]
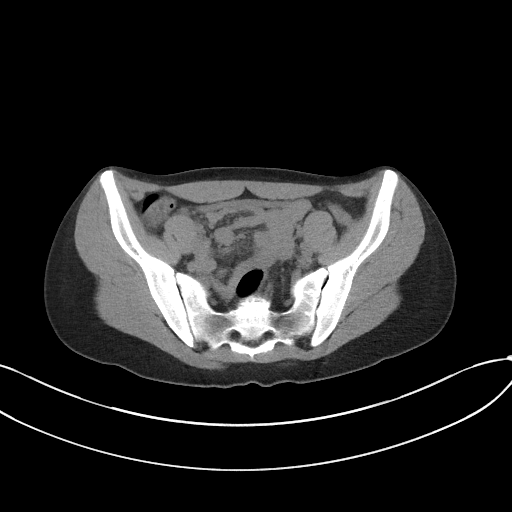
[im 38/86  soft-tissue]
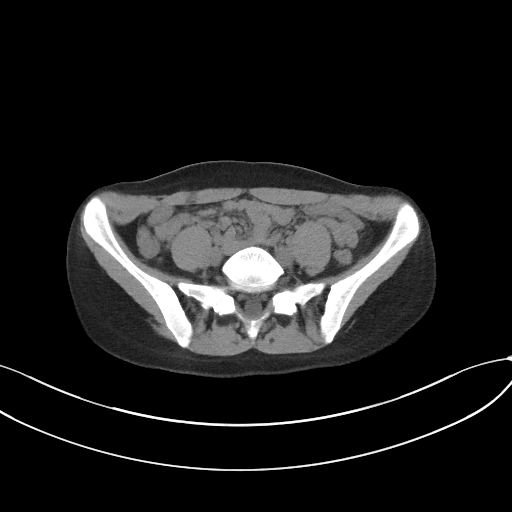
[im 45/86  soft-tissue]
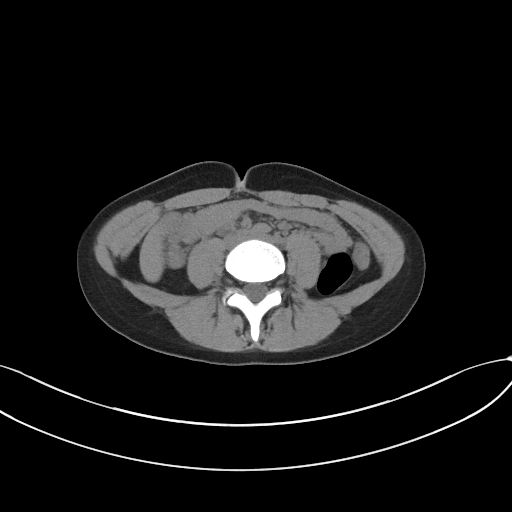
[im 48/86  soft-tissue]
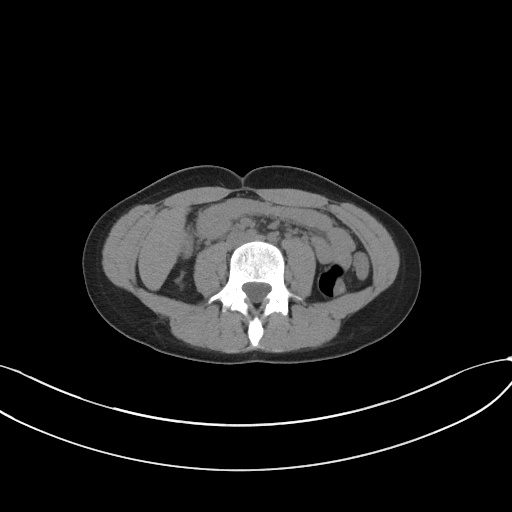
[im 55/86  soft-tissue]
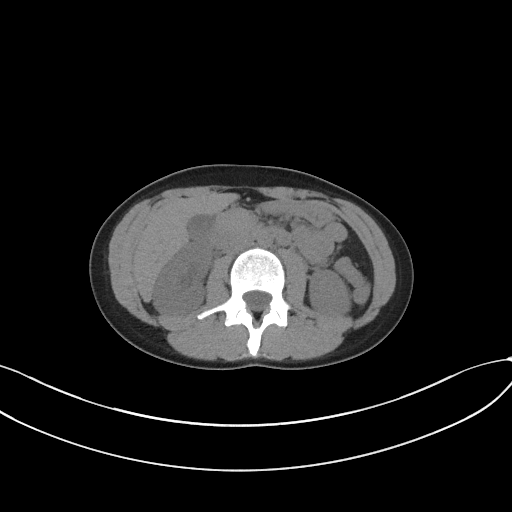
[im 55/86  bone]
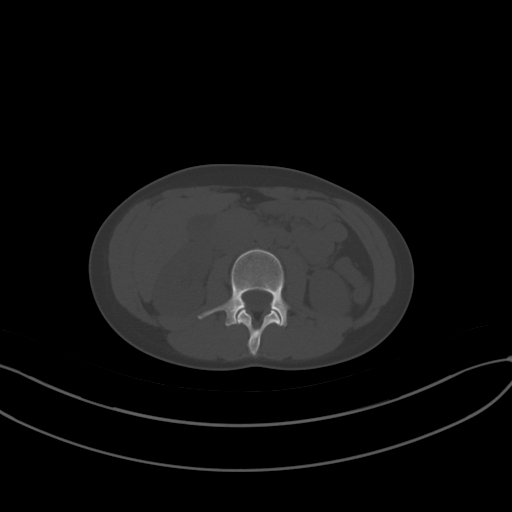
[im 62/86  soft-tissue]
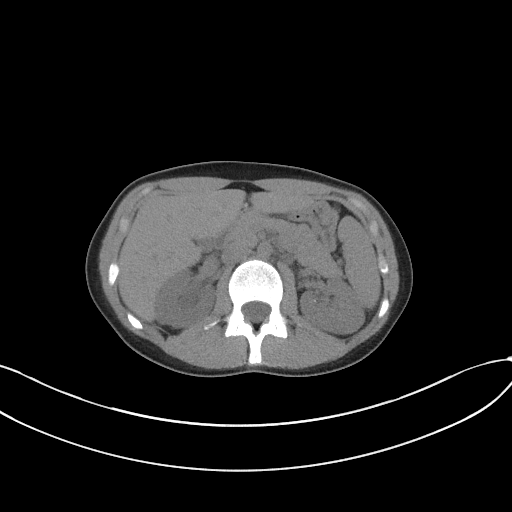
[im 69/86  soft-tissue]
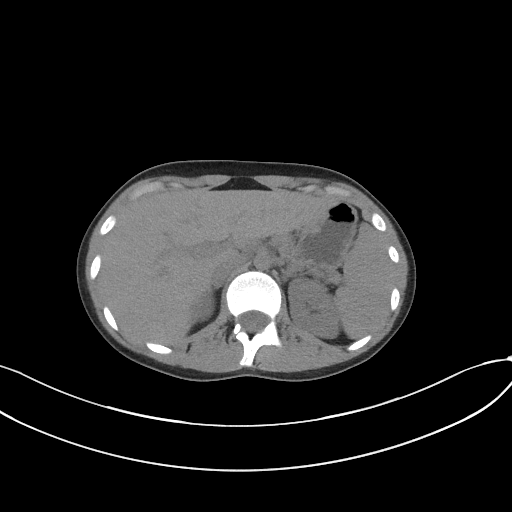
[im 75/86  soft-tissue]
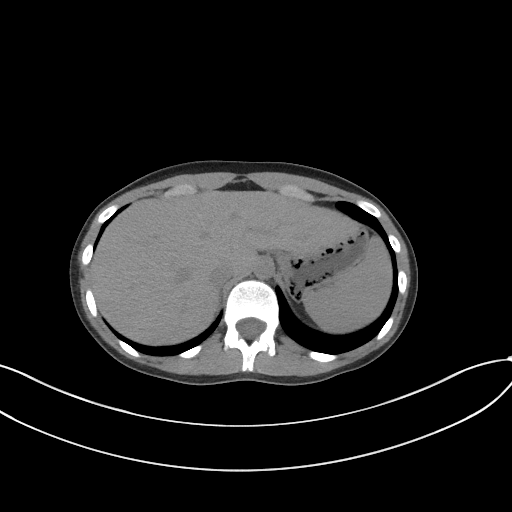
[im 82/86  soft-tissue]
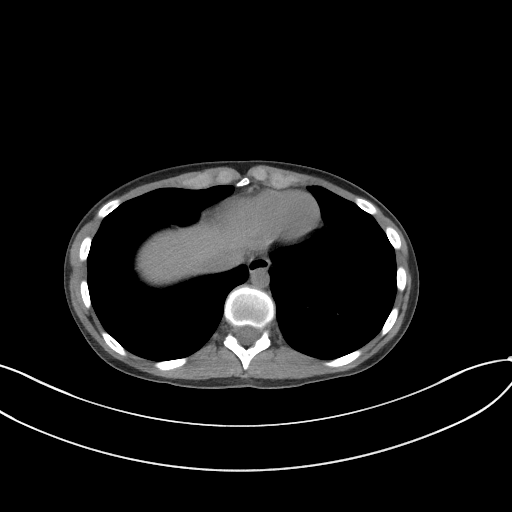

[Series 5: coronal st · coronal · 0.69mm/px · 3 of 61 slices shown]
[im 21/61  soft-tissue]
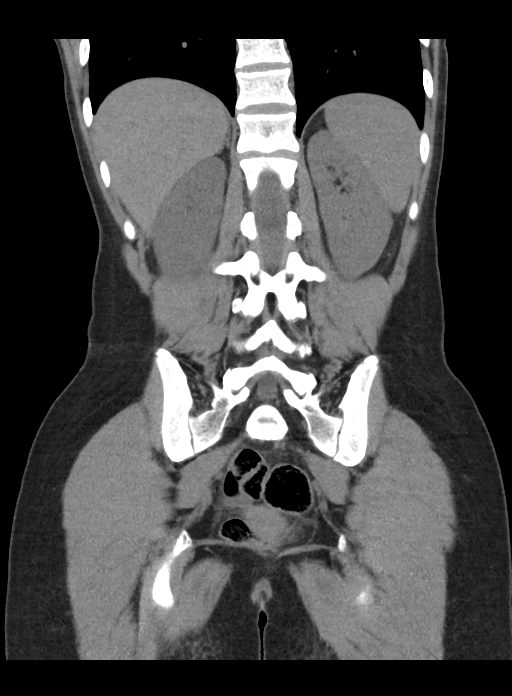
[im 27/61  soft-tissue]
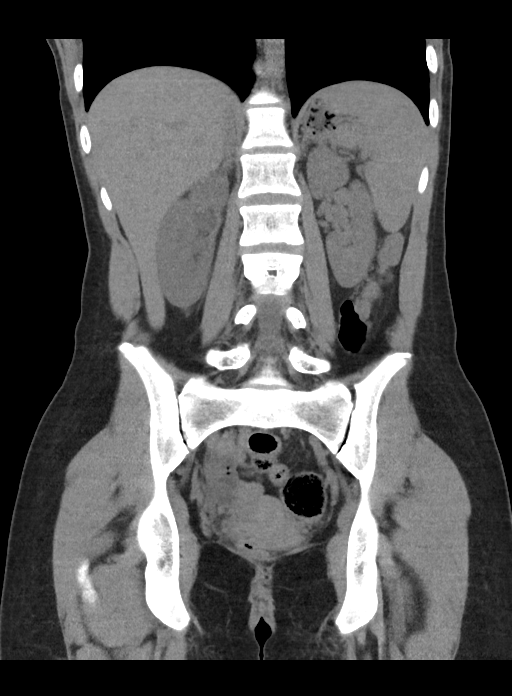
[im 34/61  soft-tissue]
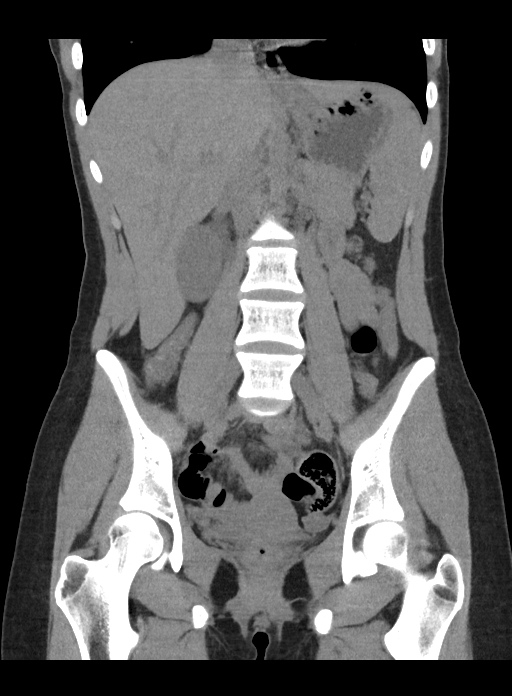

[16 of 46 positions shown; findings below may reference images not displayed]

FINDINGS: Lower chest: Normal

Hepatobiliary: Normal

Pancreas: Normal

Spleen: Normal

Adrenals/Urinary Tract: Adrenal glands are normal. Left kidney is
normal except for a nonobstructing 2 mm stone in the midportion.
Right kidney shows swelling and hydroureteronephrosis with the
ureter being dilated all the way to the UVJ where there is a 1.5 mm
stone.

Stomach/Bowel: Normal

Vascular/Lymphatic: Normal

Reproductive: Normal

Other: None

Musculoskeletal: Normal
IMPRESSION: 1.5 mm stone at the right UVJ with mild right hydroureteronephrosis.

2 mm nonobstructing stone in the midportion of the left kidney.

## 2022-05-16 ENCOUNTER — Other Ambulatory Visit: Payer: Self-pay | Admitting: Nurse Practitioner

## 2022-05-16 DIAGNOSIS — F419 Anxiety disorder, unspecified: Secondary | ICD-10-CM

## 2022-05-23 NOTE — Progress Notes (Unsigned)
Established patient visit   Patient: Christy Brown   DOB: [birth year]   22 y.o. Female  MRN: [id number] Visit Date: 05/24/2022   No chief complaint on file.  Subjective    HPI  Follow up  -GAD --takes sertraline 25 mg daily  --propranolol 40 mg taken as needed -normal well-woman exsam 09/2021. .    Medications: Outpatient Medications Prior to Visit  Medication Sig   albuterol (VENTOLIN HFA) 108 (90 Base) MCG/ACT inhaler TAKE 2 PUFFS BY MOUTH EVERY 6 HOURS AS NEEDED FOR WHEEZE OR SHORTNESS OF BREATH   drospirenone-ethinyl estradiol (NIKKI) 3-0.02 MG tablet Take 1 tablet by mouth daily.   propranolol (INDERAL) 40 MG tablet TAKE 1 TABLET (40 MG TOTAL) BY MOUTH 2 (TWO) TIMES DAILY AS NEEDED (ANXIETY).   sertraline (ZOLOFT) 25 MG tablet Take 1 tablet (25 mg total) by mouth daily.   No facility-administered medications prior to visit.    Review of Systems  {Labs (Optional):23779}   Objective    There were no vitals filed for this visit. There is no height or weight on file to calculate BMI.  BP Readings from Last 3 Encounters:  01/20/22 98/64  10/21/21 108/74  02/23/21 117/69    Wt Readings from Last 3 Encounters:  01/20/22 114 lb (51.7 kg) (22 %, Z= -0.77)*  10/21/21 113 lb (51.3 kg) (21 %, Z= -0.82)*  02/23/21 115 lb (52.2 kg) (27 %, Z= -0.62)*   * Growth percentiles are based on CDC (Girls, 2-20 Years) data.    Physical Exam  ***  No results found for any visits on 05/24/22.  Assessment & Plan     Problem List Items Addressed This Visit   None    No follow-ups on file.         Ronnell Freshwater, NP  [facility] [phone] (phone) [phone] (fax)  [place]

## 2022-05-24 ENCOUNTER — Encounter: Payer: Self-pay | Admitting: Nurse Practitioner

## 2022-05-24 ENCOUNTER — Ambulatory Visit (INDEPENDENT_AMBULATORY_CARE_PROVIDER_SITE_OTHER): Payer: BC Managed Care – PPO | Admitting: Nurse Practitioner

## 2022-05-24 VITALS — BP 99/65 | HR 79 | Ht 64.0 in | Wt 110.0 lb

## 2022-05-24 DIAGNOSIS — F411 Generalized anxiety disorder: Secondary | ICD-10-CM | POA: Insufficient documentation

## 2022-05-24 DIAGNOSIS — F330 Major depressive disorder, recurrent, mild: Secondary | ICD-10-CM | POA: Insufficient documentation

## 2022-05-24 DIAGNOSIS — F419 Anxiety disorder, unspecified: Secondary | ICD-10-CM

## 2022-05-24 DIAGNOSIS — F32A Depression, unspecified: Secondary | ICD-10-CM | POA: Diagnosis not present

## 2022-05-24 MED ORDER — SERTRALINE HCL 25 MG PO TABS: 25.0000 mg | ORAL_TABLET | Freq: Every day | ORAL | 1 refills | Status: DC

## 2022-10-05 ENCOUNTER — Other Ambulatory Visit: Payer: Self-pay | Admitting: Nurse Practitioner

## 2022-10-05 DIAGNOSIS — F32A Depression, unspecified: Secondary | ICD-10-CM

## 2022-10-05 DIAGNOSIS — N946 Dysmenorrhea, unspecified: Secondary | ICD-10-CM

## 2022-10-05 DIAGNOSIS — Z3041 Encounter for surveillance of contraceptive pills: Secondary | ICD-10-CM

## 2022-10-05 DIAGNOSIS — F419 Anxiety disorder, unspecified: Secondary | ICD-10-CM

## 2022-10-06 NOTE — Telephone Encounter (Signed)
Medication refill request: Christy Brown  Last AEX:  10/21/21 Next AEX: not yet scheduled  Last MMG (if hormonal medication request): na Refill authorized: 84 with 0 rf sent for today. Note sent to pharmacy for patient to schedule yearly appointment.

## 2022-11-22 ENCOUNTER — Encounter: Payer: Self-pay | Admitting: Family Medicine

## 2022-11-22 ENCOUNTER — Ambulatory Visit (INDEPENDENT_AMBULATORY_CARE_PROVIDER_SITE_OTHER): Payer: BC Managed Care – PPO | Admitting: Family Medicine

## 2022-11-22 VITALS — BP 110/71 | HR 134 | Resp 20 | Ht 64.0 in | Wt 114.0 lb

## 2022-11-22 DIAGNOSIS — F330 Major depressive disorder, recurrent, mild: Secondary | ICD-10-CM | POA: Diagnosis not present

## 2022-11-22 DIAGNOSIS — F411 Generalized anxiety disorder: Secondary | ICD-10-CM

## 2022-11-22 MED ORDER — SERTRALINE HCL 25 MG PO TABS: 25.0000 mg | ORAL_TABLET | Freq: Every day | ORAL | 1 refills | Status: DC

## 2022-11-22 NOTE — Progress Notes (Signed)
Established Patient Office Visit  Subjective   Patient ID: Christy Brown, female    DOB: December 15, 2000  Age: 22 y.o. MRN: [id number]  Chief Complaint  Patient presents with   Anxiety   Depression    HPI Christy Brown is a 22 y.o. female presenting today for follow up of anxiety and depression. Mood: Patient is here to follow up for anxiety and depression, currently managing with sertraline. Taking medication without side effects, reports excellent compliance with treatment. Denies mood changes or SI/HI. She feels mood is fairly stable since last visit.  She ran out of her sertraline for about 1 week and realized how much she needs it to maintain her mood, after restarting it has been happy with where she is at.  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     11/22/2022   10:42 AM 05/24/2022    9:44 AM 01/20/2022   10:54 AM  Depression screen PHQ 2/9  Decreased Interest 1 1 3   Down, Depressed, Hopeless 1 1 3   PHQ - 2 Score 2 2 6   Altered sleeping 3 3 3   Tired, decreased energy 3 2 3   Change in appetite 3 2 3   Feeling bad or failure about yourself  2 2 3   Trouble concentrating 2 1 3   Moving slowly or fidgety/restless 0 0 1  Suicidal thoughts 0 0 0  PHQ-9 Score 15 12 22   Difficult doing work/chores Very difficult  Very difficult       11/22/2022   10:42 AM 05/24/2022    9:44 AM 01/20/2022   10:54 AM 01/19/2021    2:16 PM  GAD 7 : Generalized Anxiety Score  Nervous, Anxious, on Edge 3 1 3 3   Control/stop worrying 2 1 3 3   Worry too much - different things 3 2 3 3   Trouble relaxing 2 0 1 1  Restless 0 0 1 1  Easily annoyed or irritable 2 1 3 1   Afraid - awful might happen 0 0 1 1  Total GAD 7 Score 12 5 15 13   Anxiety Difficulty Very difficult  Very difficult Very difficult   ROS Negative unless otherwise noted in HPI    Objective:     BP 110/71 (BP Location: Left Arm, Patient Position: Sitting, Cuff Size: Normal)   Pulse (!) 134   Resp 20   Ht 5\' 4"  (1.626 m)   Wt 114 lb (51.7 kg)   SpO2 97%   BMI 19.57 kg/m   Physical Exam Constitutional:      General: She is not in acute distress.    Appearance: Normal appearance.  HENT:     Head: Normocephalic and atraumatic.  Pulmonary:     Effort: Pulmonary effort is normal. No respiratory distress.  Musculoskeletal:     Cervical back: Normal range of motion.  Neurological:     General: No focal deficit present.     Mental Status: She is alert and oriented to person, place, and time. Mental status is at baseline.  Psychiatric:        Mood and Affect: Mood normal.        Thought Content: Thought content normal.        Judgment: Judgment normal.     Assessment & Plan:  GAD (generalized anxiety disorder) Assessment & Plan: GAD-7 score 12, higher  than baseline.  Patient is aware that we can increase her dose of sertraline, but she would like to stay where she is at for now.  She will send me a message on MyChart if she does ever feel need to increase her dose.  Continue sertraline 25 mg daily.  Will continue to monitor.  Orders: -     Sertraline HCl; Take 1 tablet (25 mg total) by mouth daily.  Dispense: 90 tablet; Refill: 1  MDD (major depressive disorder), recurrent episode, mild (HCC) Assessment & Plan: PHQ-9 score 15, increased from last appointment.  Continue sertraline 25 mg daily.  Patient is aware that she can increase if needed in the future.  She will send me a MyChart message if she wishes to do so.  Orders: -     Sertraline HCl; Take 1 tablet (25 mg total) by mouth daily.  Dispense: 90 tablet; Refill: 1  She has not had any increased risk of diabetes, hypercholesterolemia, hypertension, thyroid disease.  We will not need to get lab work for her annual physical.  Return in about 3 months (around 02/22/2023) for annual physical.     Melida Quitter, PA

## 2022-11-22 NOTE — Assessment & Plan Note (Signed)
PHQ-9 score 15, increased from last appointment.  Continue sertraline 25 mg daily.  Patient is aware that she can increase if needed in the future.  She will send me a MyChart message if she wishes to do so.

## 2022-11-22 NOTE — Patient Instructions (Signed)
It was nice to meet you today! Send me a message if you need anything, otherwise hang in there and I will see you in a few months :)

## 2022-11-22 NOTE — Assessment & Plan Note (Addendum)
GAD-7 score 12, higher than baseline.  Patient is aware that we can increase her dose of sertraline, but she would like to stay where she is at for now.  She will send me a message on MyChart if she does ever feel need to increase her dose.  Continue sertraline 25 mg daily.  Will continue to monitor.

## 2022-12-15 ENCOUNTER — Encounter: Payer: Self-pay | Admitting: Nurse Practitioner

## 2022-12-15 ENCOUNTER — Ambulatory Visit (INDEPENDENT_AMBULATORY_CARE_PROVIDER_SITE_OTHER): Payer: BC Managed Care – PPO | Admitting: Nurse Practitioner

## 2022-12-15 VITALS — BP 104/68 | HR 63 | Ht 64.0 in | Wt 113.0 lb

## 2022-12-15 DIAGNOSIS — Z01419 Encounter for gynecological examination (general) (routine) without abnormal findings: Secondary | ICD-10-CM

## 2022-12-15 DIAGNOSIS — Z3041 Encounter for surveillance of contraceptive pills: Secondary | ICD-10-CM

## 2022-12-15 DIAGNOSIS — N946 Dysmenorrhea, unspecified: Secondary | ICD-10-CM

## 2022-12-15 MED ORDER — DROSPIRENONE-ETHINYL ESTRADIOL 3-0.02 MG PO TABS: 1.0000 | ORAL_TABLET | Freq: Every day | ORAL | 3 refills | Status: DC

## 2022-12-15 NOTE — Progress Notes (Signed)
Christy Brown 05-04-2000 [id number]   History:  22 y.o. G0 presents for annual exam without GYN complaints. Monthly cycles. Started COCs for dysmenorrhea, good management.  Declines Gardasil. Anxiety and depression managed by PCP.   Gynecologic History Patient's last menstrual period was 11/30/2022. Period Cycle (Days): 28 Period Duration (Days): 4 Period Pattern: Regular Menstrual Flow: Moderate Menstrual Control: Tampon Menstrual Control Change Freq (Hours): 4-6 Dysmenorrhea: (!) Mild Dysmenorrhea Symptoms: Cramping Contraception/Family planning: OCP (estrogen/progesterone) Sexually active: Yes, declines STD screening  Health Maintenance Last Pap: Not indicated Last mammogram: Not indicated Last colonoscopy: Not indicated Last Dexa: Not indicated  Past medical history, past surgical history, family history and social history were all reviewed and documented in the EPIC chart. Boyfriend of 2 years. 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 Plans to work with brother.   ROS:  A ROS was performed and pertinent positives and negatives are included.  Exam:  Vitals:   12/15/22 1040  BP: 104/68  Pulse: 63  SpO2: 100%  Weight: 113 lb (51.3 kg)  Height: 5\' 4"  (1.626 m)      Body mass index is 19.4 kg/m.  General appearance:  Normal Thyroid:  Symmetrical, normal in size, without palpable masses or nodularity. Respiratory  Auscultation:  Clear without wheezing or rhonchi Cardiovascular  Auscultation:  Regular rate, without rubs, murmurs or gallops  Edema/varicosities:  Not grossly evident Abdominal  Soft,nontender, without masses, guarding or rebound.  Liver/spleen:  No organomegaly noted  Hernia:  None appreciated  Skin  Inspection:  Grossly normal Breasts: Not indicated per guidelines Pelvic: External genitalia:  no lesions              Urethra:  normal appearing urethra with no masses, tenderness or lesions              Bartholins and Skenes:  normal                 Vagina: Speculum exam not performed Bimanual Exam:  Uterus:  no masses or tenderness              Adnexa: no mass, fullness, tenderness              Rectovaginal: Deferred              Anus:  normal  Patient informed chaperone available to be present for breast and pelvic exam. Patient has requested no chaperone to be present. Patient has been advised what will be completed during breast and pelvic exam.   Assessment/Plan:  22 y.o. G0 for annual exam.   Well female exam with routine gynecological exam - Education provided on SBEs, importance of preventative screenings, current guidelines, high calcium diet, regular exercise, safe sex, and multivitamin daily.   Encounter for surveillance of contraceptive pills - Plan: drospirenone-ethinyl estradiol (NIKKI) 3-0.02 MG tablet daily. Taking as prescribe. Refill x 1 year provided.   Dysmenorrhea - Plan: drospirenone-ethinyl estradiol (NIKKI) 3-0.02 MG tablet daily. Good management.   Follow up in 1 year for annual.     Christy Brown [facility], 10:59 AM 12/15/2022

## 2023-02-22 ENCOUNTER — Encounter: Payer: BC Managed Care – PPO | Admitting: Family Medicine

## 2023-03-17 ENCOUNTER — Ambulatory Visit (INDEPENDENT_AMBULATORY_CARE_PROVIDER_SITE_OTHER): Payer: BC Managed Care – PPO | Admitting: Family Medicine

## 2023-03-17 ENCOUNTER — Encounter: Payer: Self-pay | Admitting: Family Medicine

## 2023-03-17 VITALS — BP 96/63 | HR 85 | Ht 64.0 in | Wt 117.8 lb

## 2023-03-17 DIAGNOSIS — F411 Generalized anxiety disorder: Secondary | ICD-10-CM | POA: Diagnosis not present

## 2023-03-17 DIAGNOSIS — Z0000 Encounter for general adult medical examination without abnormal findings: Secondary | ICD-10-CM | POA: Diagnosis not present

## 2023-03-17 DIAGNOSIS — F330 Major depressive disorder, recurrent, mild: Secondary | ICD-10-CM | POA: Diagnosis not present

## 2023-03-17 MED ORDER — SERTRALINE HCL 25 MG PO TABS: 25.0000 mg | ORAL_TABLET | Freq: Every day | ORAL | 3 refills | Status: AC

## 2023-03-17 NOTE — Patient Instructions (Signed)
You can come in sooner if we need to make any changes to your current medication routine or you have any other concerns.  Otherwise, keep up the good work and we will see you for your annual physical next year!

## 2023-03-17 NOTE — Progress Notes (Signed)
Complete physical exam  Patient: Christy Brown   DOB: Nov 23, 2000   22 y.o. Female  MRN: [id number]  Subjective:    Chief Complaint  Patient presents with   Annual Exam    Christy Brown is a 22 y.o. female who presents today for a complete physical exam. She reports consuming a general diet.  She is currently in school getting her bachelor's degree and is very ready to graduate in May 2026.  She generally feels well. She reports sleeping well. She does not have additional problems to discuss today.  She feels that her current dose of Zoloft is appropriate.  When she previously tried a higher dose it caused her to become angry and irritable, so she would like to maintain her current dose.   Most recent fall risk assessment:    05/24/2022    9:45 AM  Fall Risk   Falls in the past year? 0  Number falls in past yr: 0  Injury with Fall? 0  Follow up Falls evaluation completed     Most recent depression and anxiety screenings:    03/17/2023    3:39 PM 11/22/2022   10:42 AM  PHQ 2/9 Scores  PHQ - 2 Score 4 2  PHQ- 9 Score 16 15      11/22/2022   10:42 AM 05/24/2022    9:44 AM 01/20/2022   10:54 AM 01/19/2021    2:16 PM  GAD 7 : Generalized Anxiety Score  Nervous, Anxious, on Edge 3 1 3 3   Control/stop worrying 2 1 3 3   Worry too much - different things 3 2 3 3   Trouble relaxing 2 0 1 1  Restless 0 0 1 1  Easily annoyed or irritable 2 1 3 1   Afraid - awful might happen 0 0 1 1  Total GAD 7 Score 12 5 15 13   Anxiety Difficulty Very difficult  Very difficult Very difficult    Patient Active Problem List   Diagnosis Date Noted   MDD (major depressive disorder), recurrent episode, mild (HCC) 05/24/2022   GAD (generalized anxiety disorder) 05/24/2022   Exercise-induced asthma 09/20/2019   Dysmenorrhea in adolescent 04/27/2017    History reviewed. No pertinent surgical history. Social History   Tobacco Use   Smoking status: Never   Smokeless tobacco: Never   Vaping Use   Vaping status: Never Used  Substance Use Topics   Alcohol use: Yes    Comment: occasionally   Drug use: No   Family History  Problem Relation Age of Onset   Prostate cancer Father    Heart disease Maternal Grandfather    Heart attack Maternal Grandfather    No Known Allergies   Patient Care Team: Melida Quitter, PA as PCP - General (Family Medicine)   Outpatient Medications Prior to Visit  Medication Sig   albuterol (VENTOLIN HFA) 108 (90 Base) MCG/ACT inhaler TAKE 2 PUFFS BY MOUTH EVERY 6 HOURS AS NEEDED FOR WHEEZE OR SHORTNESS OF BREATH   DENTA 5000 PLUS 1.1 % CREA dental cream Place 1 Application onto teeth at bedtime.   drospirenone-ethinyl estradiol (NIKKI) 3-0.02 MG tablet Take 1 tablet by mouth daily.   propranolol (INDERAL) 40 MG tablet TAKE 1 TABLET (40 MG TOTAL) BY MOUTH 2 (TWO) TIMES DAILY AS NEEDED (ANXIETY).   [DISCONTINUED] sertraline (ZOLOFT) 25 MG tablet Take 1 tablet (25 mg total) by mouth daily.   No facility-administered medications prior to visit.    Review of Systems  Constitutional:  Negative for chills, fever and malaise/fatigue.  HENT:  Negative for congestion and hearing loss.   Eyes:  Negative for blurred vision and double vision.  Respiratory:  Negative for cough and shortness of breath.   Cardiovascular:  Positive for chest pain (1 episode of 2 days of musculoskeletal pain of the upper lateral right chest). Negative for palpitations and leg swelling.  Gastrointestinal:  Negative for abdominal pain, constipation, diarrhea and heartburn.  Genitourinary:  Negative for frequency and urgency.  Musculoskeletal:  Negative for myalgias and neck pain.  Neurological:  Negative for headaches.  Endo/Heme/Allergies:  Negative for polydipsia.  Psychiatric/Behavioral:  Negative for depression. The patient is not nervous/anxious.       Objective:    BP 96/63   Pulse 85   Ht 5\' 4"  (1.626 m)   Wt 117 lb 12.8 oz (53.4 kg)   LMP 02/25/2023    SpO2 99%   BMI 20.22 kg/m    Physical Exam Constitutional:      General: She is not in acute distress.    Appearance: Normal appearance.  HENT:     Head: Normocephalic and atraumatic.     Right Ear: Tympanic membrane, ear canal and external ear normal.     Left Ear: Tympanic membrane, ear canal and external ear normal.     Nose: Nose normal.     Mouth/Throat:     Mouth: Mucous membranes are moist.     Pharynx: No oropharyngeal exudate or posterior oropharyngeal erythema.  Eyes:     Extraocular Movements: Extraocular movements intact.     Conjunctiva/sclera: Conjunctivae normal.     Pupils: Pupils are equal, round, and reactive to light.     Comments: Does not wear glasses or contacts  Neck:     Thyroid: No thyroid mass, thyromegaly or thyroid tenderness.  Cardiovascular:     Rate and Rhythm: Normal rate and regular rhythm.     Heart sounds: Normal heart sounds. No murmur heard.    No friction rub. No gallop.  Pulmonary:     Effort: Pulmonary effort is normal. No respiratory distress.     Breath sounds: Normal breath sounds. No wheezing, rhonchi or rales.  Abdominal:     General: Abdomen is flat. Bowel sounds are normal. There is no distension.     Palpations: There is no mass.     Tenderness: There is no abdominal tenderness. There is no guarding.  Musculoskeletal:        General: Normal range of motion.     Cervical back: Normal range of motion and neck supple.  Lymphadenopathy:     Cervical: No cervical adenopathy.  Skin:    General: Skin is warm and dry.  Neurological:     Mental Status: She is alert and oriented to person, place, and time.     Cranial Nerves: No cranial nerve deficit.     Motor: No weakness.     Deep Tendon Reflexes: Reflexes normal.  Psychiatric:        Mood and Affect: Mood normal.        Assessment & Plan:    Routine Health Maintenance and Physical Exam  Immunization History  Administered Date(s) Administered   HIB (PRP-OMP)  05/07/2002, 07/10/2002, 09/04/2002, 03/29/2003   Hepatitis A 12/16/2009, 12/08/2010   Hepatitis B 2001/04/25, 05/07/2002, 12/08/2002   MMR 03/29/2003, 11/18/2006   Meningococcal B, OMV 11/08/2019   Tdap 03/15/2019   Varicella 06/10/2003, 11/18/2006    Health Maintenance  Topic Date Due  COVID-19 Vaccine (1 - 2024-25 season) Never done   Cervical Cancer Screening (Pap smear)  Never done   INFLUENZA VACCINE  06/27/2023 (Originally 10/28/2022)   CHLAMYDIA SCREENING  12/15/2023 (Originally 10/22/2022)   HPV VACCINES (1 - 3-dose series) 12/15/2023 (Originally 03/06/2017)   Hepatitis C Screening  12/15/2023 (Originally 03/06/2020)   HIV Screening  12/15/2023 (Originally 03/06/2017)   DTaP/Tdap/Td (2 - Td or Tdap) 03/14/2029   Up-to-date on tetanus booster. Discussed recommendations for cervical cancer screening.  Patient is established with gynecology, annual appointment with them will be in September 2025.  Discussed health benefits of physical activity, and encouraged her to engage in regular exercise appropriate for her age and condition.  Wellness examination  MDD (major depressive disorder), recurrent episode, mild (HCC) Assessment & Plan: Patient states that she is happy with her current dose of Zoloft and does not wish to make any changes at this time.  Continue Zoloft 25 mg daily, will continue to monitor.  Orders: -     Sertraline HCl; Take 1 tablet (25 mg total) by mouth daily.  Dispense: 90 tablet; Refill: 3  GAD (generalized anxiety disorder) -     Sertraline HCl; Take 1 tablet (25 mg total) by mouth daily.  Dispense: 90 tablet; Refill: 3    Return in about 1 year (around 03/16/2024) for annual physical, fasting blood work 1 week before.     Melida Quitter, PA

## 2023-03-17 NOTE — Assessment & Plan Note (Signed)
Patient states that she is happy with her current dose of Zoloft and does not wish to make any changes at this time.  Continue Zoloft 25 mg daily, will continue to monitor.

## 2023-05-05 ENCOUNTER — Encounter: Payer: Self-pay | Admitting: Family Medicine

## 2023-11-20 ENCOUNTER — Other Ambulatory Visit: Payer: Self-pay | Admitting: Nurse Practitioner

## 2023-11-20 DIAGNOSIS — N946 Dysmenorrhea, unspecified: Secondary | ICD-10-CM

## 2023-11-20 DIAGNOSIS — Z3041 Encounter for surveillance of contraceptive pills: Secondary | ICD-10-CM

## 2023-11-21 NOTE — Telephone Encounter (Signed)
.  Med refill request: Christy Brown  Last AEX: 12/15/22 Next AEX:01/10/24 Last MMG (if hormonal med) Refill authorized: Please Advise?

## 2024-01-09 NOTE — Progress Notes (Unsigned)
   Christy Brown 06-02-2000 [id number]   History:  23 y.o. G0 presents for annual exam without GYN complaints. Monthly cycles. Started COCs for dysmenorrhea, good management.  Declines Gardasil. Anxiety and depression managed by PCP.   Gynecologic History Patient's last menstrual period was 12/30/2023 (exact date). Period Duration (Days): 3 Period Pattern: Regular Menstrual Flow: Light Menstrual Control: Tampon Dysmenorrhea: (!) Mild Dysmenorrhea Symptoms: Cramping Contraception/Family planning: OCP (estrogen/progesterone) Sexually active: Yes  Health Maintenance Last Pap: Never Last mammogram: Not indicated Last colonoscopy: Not indicated Last Dexa: Not indicated     01/10/2024    9:48 AM  Depression screen PHQ 2/9  Decreased Interest 1  Down, Depressed, Hopeless 1  PHQ - 2 Score 2  Altered sleeping 3  Tired, decreased energy 2  Change in appetite 1  Feeling bad or failure about yourself  0  Trouble concentrating 2  Moving slowly or fidgety/restless 0  Suicidal thoughts 0  PHQ-9 Score 10  Difficult doing work/chores Somewhat difficult     Past medical history, past surgical history, family history and social history were all reviewed and documented in the EPIC chart. Engaged, getting married next June. Senior at [employer].  ROS:  A ROS was performed and pertinent positives and negatives are included.  Exam:  Vitals:   01/10/24 0944  BP: 100/64  Pulse: 71  SpO2: 97%  Weight: 124 lb (56.2 kg)  Height: 5' 4.75 (1.645 m)       Body mass index is 20.79 kg/m.  General appearance:  Normal Thyroid :  Symmetrical, normal in size, without palpable masses or nodularity. Respiratory  Auscultation:  Clear without wheezing or rhonchi Cardiovascular  Auscultation:  Regular rate, without rubs, murmurs or gallops  Edema/varicosities:  Not grossly evident Abdominal  Soft,nontender, without masses, guarding or rebound.  Liver/spleen:  No organomegaly noted  Hernia:   None appreciated  Skin  Inspection:  Grossly normal Breasts: Not indicated per guidelines Pelvic: External genitalia:  no lesions              Urethra:  normal appearing urethra with no masses, tenderness or lesions              Bartholins and Skenes: normal                 Vagina: Speculum exam not performed Bimanual Exam:  Uterus:  no masses or tenderness              Adnexa: no mass, fullness, tenderness              Rectovaginal: Deferred              Anus:  normal  Christy Brown, CMA present as chaperone.   Assessment/Plan:  23 y.o. G0 for annual exam.   Well female exam with routine gynecological exam - Education provided on SBEs, importance of preventative screenings, current guidelines, high calcium diet, regular exercise, safe sex, and multivitamin daily. Has PCP.   Encounter for surveillance of contraceptive pills - Plan: drospirenone -ethinyl estradiol  (NIKKI ) 3-0.02 MG tablet daily. Taking as prescribe. Refill x 1 year provided.   Dysmenorrhea - Plan: drospirenone -ethinyl estradiol  (NIKKI ) 3-0.02 MG tablet daily. Good management.   Cervical cancer screening - Plan: Cytology - PAP( [place])  Return in about 1 year (around 01/09/2025) for Annual.     Christy Brown [facility], 10:14 AM 01/10/2024

## 2024-01-10 ENCOUNTER — Encounter: Payer: Self-pay | Admitting: Nurse Practitioner

## 2024-01-10 ENCOUNTER — Ambulatory Visit: Admitting: Nurse Practitioner

## 2024-01-10 ENCOUNTER — Other Ambulatory Visit (HOSPITAL_COMMUNITY)
Admission: RE | Admit: 2024-01-10 | Discharge: 2024-01-10 | Disposition: A | Discharge status: Home / Self Care | Source: Ambulatory Visit | Attending: Nurse Practitioner | Admitting: Nurse Practitioner

## 2024-01-10 VITALS — BP 100/64 | HR 71 | Ht 64.75 in | Wt 124.0 lb

## 2024-01-10 DIAGNOSIS — Z3041 Encounter for surveillance of contraceptive pills: Secondary | ICD-10-CM

## 2024-01-10 DIAGNOSIS — Z1331 Encounter for screening for depression: Secondary | ICD-10-CM

## 2024-01-10 DIAGNOSIS — Z01419 Encounter for gynecological examination (general) (routine) without abnormal findings: Secondary | ICD-10-CM | POA: Insufficient documentation

## 2024-01-10 DIAGNOSIS — Z124 Encounter for screening for malignant neoplasm of cervix: Secondary | ICD-10-CM | POA: Insufficient documentation

## 2024-01-10 DIAGNOSIS — N946 Dysmenorrhea, unspecified: Secondary | ICD-10-CM | POA: Diagnosis not present

## 2024-01-10 MED ORDER — DROSPIRENONE-ETHINYL ESTRADIOL 3-0.02 MG PO TABS: 1.0000 | ORAL_TABLET | Freq: Every day | ORAL | 3 refills | Status: AC

## 2024-01-12 ENCOUNTER — Ambulatory Visit: Payer: Self-pay | Admitting: Nurse Practitioner

## 2024-01-12 LAB — CYTOLOGY - PAP: Diagnosis: NEGATIVE

## 2024-01-13 NOTE — Telephone Encounter (Signed)
 Spoke with patient, advised as seen below per Tiffany. Patient verbalizes understanding and is agreeable.    Encounter closed.
